# Patient Record
Sex: Female | Born: 1945 | Hispanic: Yes | Smoking: Never smoker
Health system: Southern US, Community
[De-identification: ages and names within clinical notes are randomized; demographics above are authoritative.]

## PROBLEM LIST (undated history)

## (undated) DIAGNOSIS — E7800 Pure hypercholesterolemia, unspecified: Secondary | ICD-10-CM

## (undated) DIAGNOSIS — I639 Cerebral infarction, unspecified: Secondary | ICD-10-CM

## (undated) DIAGNOSIS — E119 Type 2 diabetes mellitus without complications: Secondary | ICD-10-CM

## (undated) HISTORY — PX: ABDOMINAL HYSTERECTOMY: SHX81

---

## 2013-07-09 ENCOUNTER — Emergency Department (INDEPENDENT_AMBULATORY_CARE_PROVIDER_SITE_OTHER): Payer: PRIVATE HEALTH INSURANCE

## 2013-07-09 ENCOUNTER — Encounter (HOSPITAL_COMMUNITY): Payer: Self-pay | Admitting: Emergency Medicine

## 2013-07-09 ENCOUNTER — Emergency Department (INDEPENDENT_AMBULATORY_CARE_PROVIDER_SITE_OTHER)
Admission: EM | Admit: 2013-07-09 | Discharge: 2013-07-09 | Disposition: A | Discharge status: Home / Self Care | Payer: PRIVATE HEALTH INSURANCE | Source: Home / Self Care | Attending: Emergency Medicine | Admitting: Emergency Medicine

## 2013-07-09 DIAGNOSIS — R0789 Other chest pain: Secondary | ICD-10-CM

## 2013-07-09 DIAGNOSIS — J329 Chronic sinusitis, unspecified: Secondary | ICD-10-CM

## 2013-07-09 DIAGNOSIS — R071 Chest pain on breathing: Secondary | ICD-10-CM

## 2013-07-09 HISTORY — DX: Pure hypercholesterolemia, unspecified: E78.00

## 2013-07-09 HISTORY — DX: Cerebral infarction, unspecified: I63.9

## 2013-07-09 HISTORY — DX: Type 2 diabetes mellitus without complications: E11.9

## 2013-07-09 MED ORDER — AMOXICILLIN-POT CLAVULANATE 875-125 MG PO TABS: 1.0000 | ORAL_TABLET | Freq: Two times a day (BID) | ORAL | Status: AC

## 2013-07-09 NOTE — ED Notes (Addendum)
Right chest pain onset 3 weeks ago.  Pain in right chest, under right breast.  Patient reports she has been treated for the flu, seen at clinic on [address].

## 2013-07-09 NOTE — ED Provider Notes (Signed)
CSN: [id number]     Arrival date & time 07/09/13  1540 History   First MD Initiated Contact with Patient 07/09/13 1655     Chief Complaint  Patient presents with  . Chest Pain   (Consider location/radiation/quality/duration/timing/severity/associated sxs/prior Treatment) HPI  68 year old F with chest pain. Pain on right side under the breast. It radiates to the neck, but not the back, shoulder or left side. It started 2.5 weeks ago. It has been gradually worsening since that time. Difficulty breathing now, which started 2 days ago. She was moving furniture one week ago, but aside from that no heavy lifting or trauma. She had similar pain 7 months ago. It resolved on it's own and she did not go to a doctor. It resolved within one week. For this instance, she has tried tylenol without relief. It is worsened by cough and lying down. Vomiting times one. No nausea. No smoking. Pt has not had any history of DVT, PE or CAD/MI.   PMH - diabetes  Social - Pt is visiting from [place] and arrived 1.5 months ago.   Past Medical History  Diagnosis Date  . Diabetes mellitus without complication   . High cholesterol   . Stroke    Past Surgical History  Procedure Laterality Date  . Abdominal hysterectomy     No family history on file. History  Substance Use Topics  . Smoking status: Never Smoker   . Smokeless tobacco: Not on file  . Alcohol Use: No   OB History   Grav Para Term Preterm Abortions TAB SAB Ect Mult Living                 Review of Systems  Allergies  Review of patient's allergies indicates no known allergies.  Home Medications   Prior to Admission medications   Not on File   BP 141/89  Pulse 123  Temp(Src) 101.1 F (38.4 C) (Oral)  Resp 20  SpO2 98% Physical Exam Gen: middle aged Hispanic female, well appearing, NAD, pleasant and conversant HEENT: NCAT, PERRLA, EOMI, OP clear and moist, no oropharyngeal exudate, no lymphadenopathy, no thyroid tenderness,  enlargement, or nodules, neck with normal ROM, no meningismus, TM reflective without effusion  Chest wall: norma appearing, tenderness of right ribs t6-8 without deformity CV: sinus tachycardia, no m/r/g, no JVD or carotid bruits Pulm: normal WOB, CTA-B, decreased breath sounds and dullness to percussion or RLL Abd: soft, NDNT, NABS Extremities: no calf edema or tenderness Skin: warm, dry, no rashes Neuro/Psych: A&Ox4, normal affect, speech, and thought content  ED Course  Procedures (including critical care time) Labs Review Labs Reviewed - No data to display  No results found for this or any previous visit. Imaging Review Dg Chest 2 View  07/09/2013   CLINICAL DATA:  Chest pain.  EXAM: CHEST - 2 VIEW  COMPARISON:  None  FINDINGS: The heart size and mediastinal contours are within normal limits. There is no evidence of pulmonary edema, consolidation, pneumothorax, nodule or pleural fluid. The visualized skeletal structures are unremarkable.  IMPRESSION: No active disease.   Electronically Signed   By: Irish LackGlenn  Yamagata M.D.   On: 07/09/2013 18:05    Date: 07/09/2013  Rate: 117  Rhythm: sinus tachycardia  QRS Axis: left  Intervals: normal  ST/T Wave abnormalities: normal  Conduction Disutrbances:none  Narrative Interpretation: no STEMI  Old EKG Reviewed: none available      MDM   1. Sinusitis   2. Chest wall pain  Pt with fever, sore throat, cough, and tachycardia consistent with a URI. However, given the persistent right sided chest pain I wanted to rule out pneumonia and CAD since pt at higher risk with DM. Her modified well score is 1.5, so no eval for PE initiated. Pt to start augmentin for infection and follow up PRN.     Garnetta BuddyEdward V Darnell Stimson, MD 07/09/13 [phone]

## 2013-07-09 NOTE — Discharge Instructions (Signed)
Fue Firefighterun placer conocerle. Lamento que usted tiene Journalist, newspaperdolor en el pecho. Creo que la causa es la tos. Usted tiene una infeccin de los senos paranasales. Usted debe tomar un antibitico llamado Augmentin por 10 das. Si el dolor empeora o si tiene problemas para respirar, entonces por favor regrese a Glass blower/designerla clnica.  Cudese,  Dr. Clinton SawyerWilliamson

## 2013-07-10 NOTE — ED Provider Notes (Signed)
Medical screening examination/treatment/procedure(s) were performed by a resident physician and as supervising physician I was immediately available for consultation/collaboration.  Leslee Homeavid Pearline Yerby, M.D.   Reuben Likesavid C Ethylene Reznick, MD 07/10/13 [phone]

## 2015-01-04 IMAGING — CR DG CHEST 2V
2 series · 2 of 2 positions shown · non-contrast
Comparison: None

CLINICAL DATA: Chest pain.

EXAM:
CHEST - 2 VIEW

[view not recorded (1 of 2)]
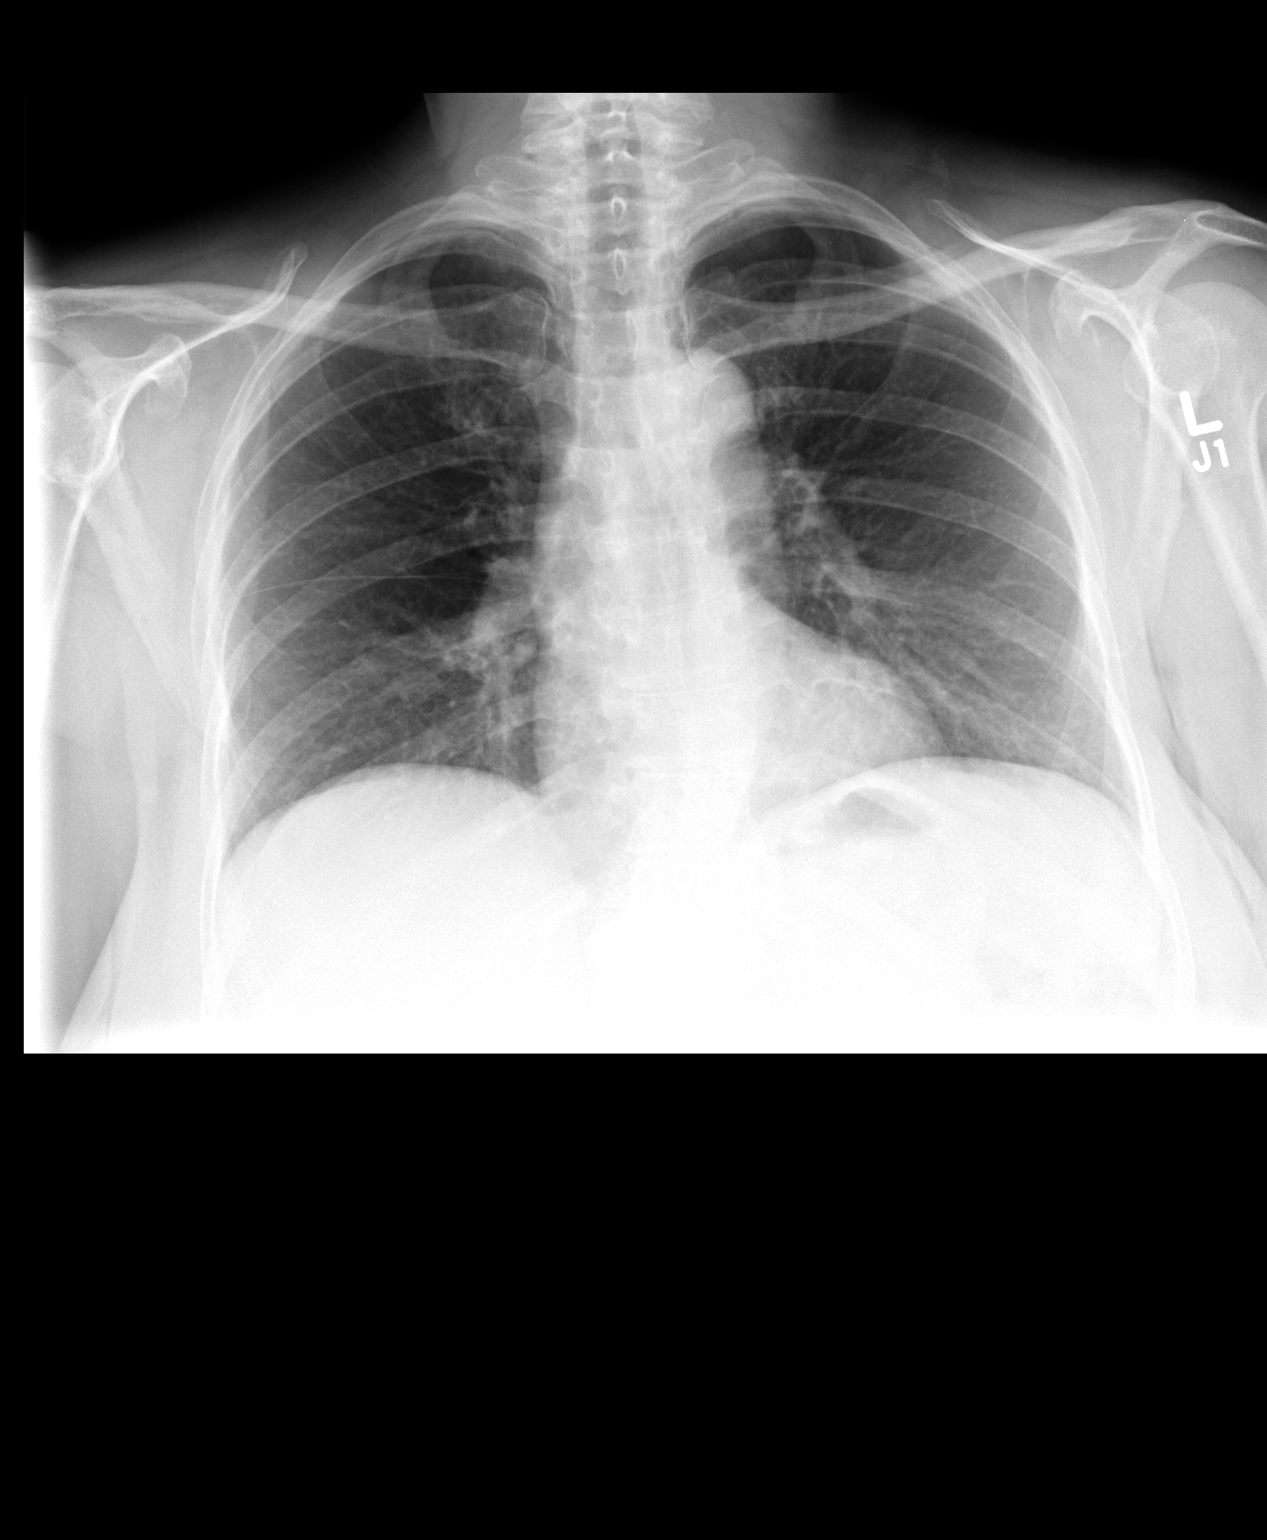

[view not recorded (2 of 2)]
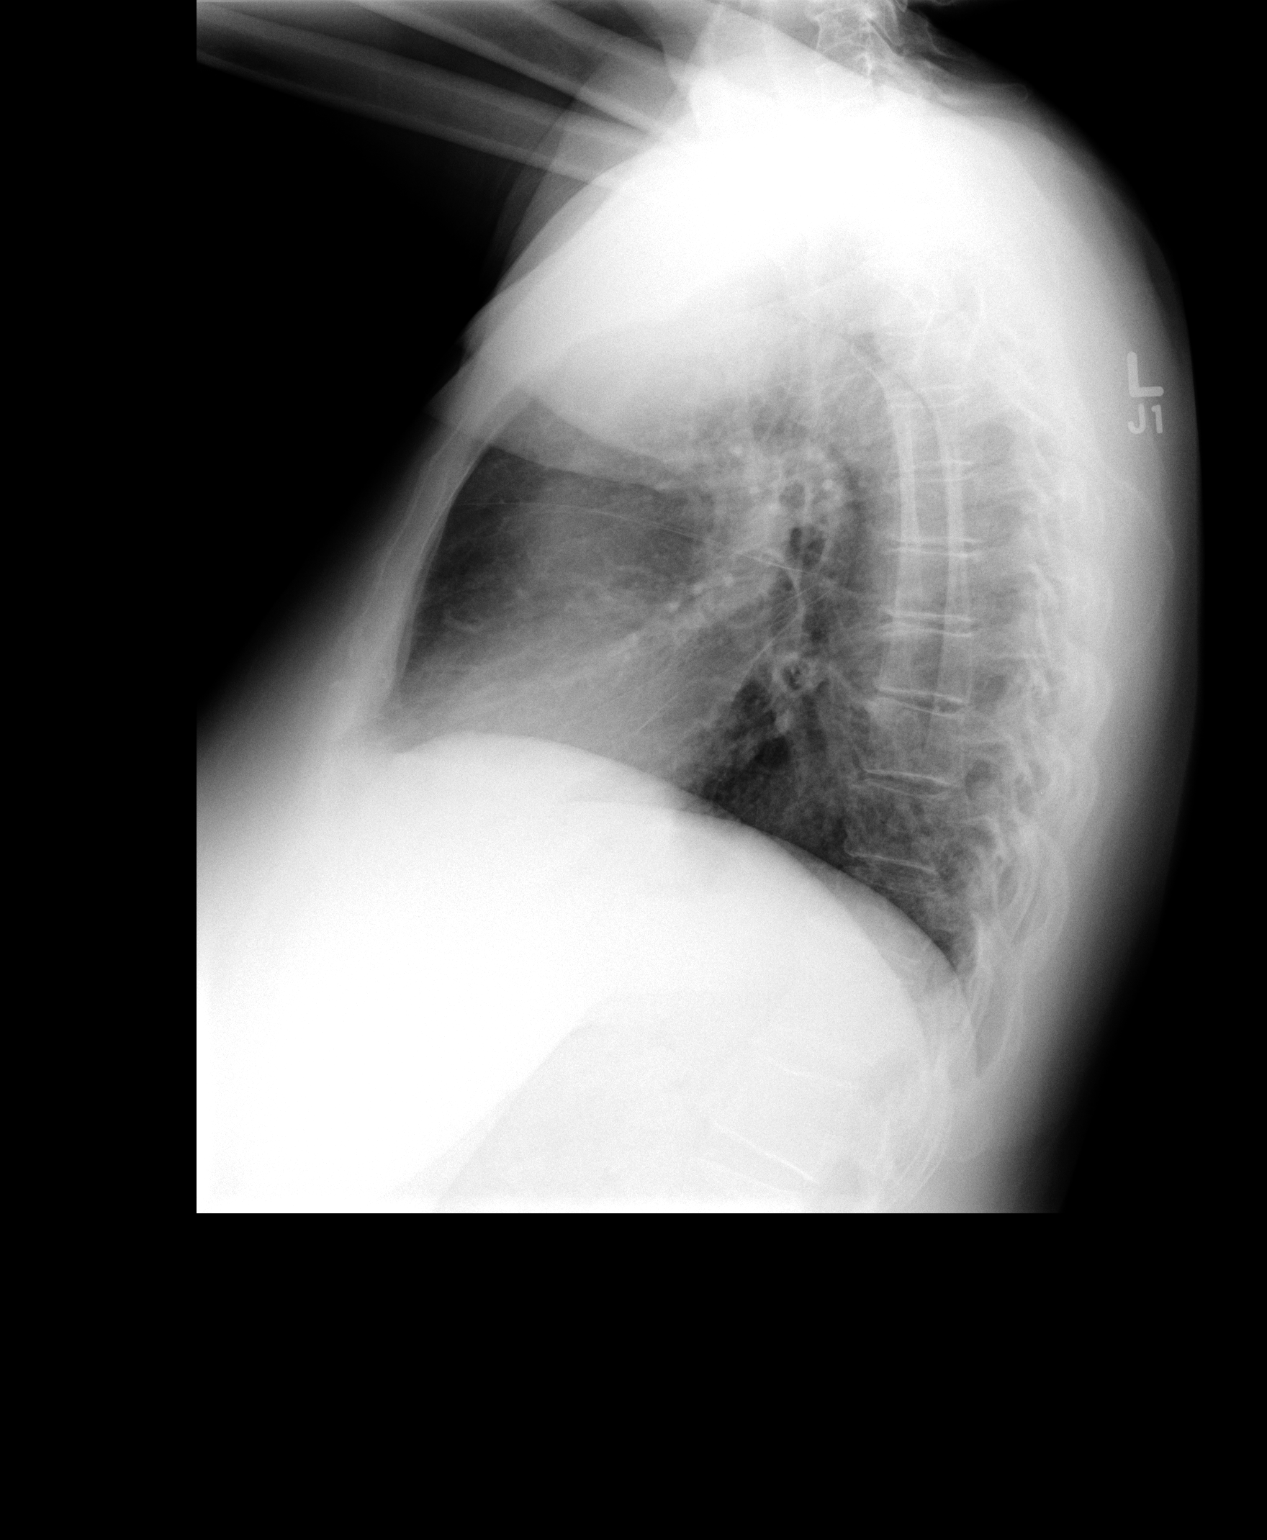

[2 of 2 positions shown; findings below may reference images not displayed]

FINDINGS: The heart size and mediastinal contours are within normal limits.
There is no evidence of pulmonary edema, consolidation,
pneumothorax, nodule or pleural fluid. The visualized skeletal
structures are unremarkable.
IMPRESSION: No active disease.
